# Patient Record
Sex: Male | Born: 1960 | Race: White | Hispanic: No | Marital: Single | Smoking: Current every day smoker
Health system: Southern US, Community
[De-identification: ages and names within clinical notes are randomized; demographics above are authoritative.]

## PROBLEM LIST (undated history)

## (undated) DIAGNOSIS — I8500 Esophageal varices without bleeding: Secondary | ICD-10-CM

## (undated) DIAGNOSIS — B1920 Unspecified viral hepatitis C without hepatic coma: Secondary | ICD-10-CM

## (undated) DIAGNOSIS — N19 Unspecified kidney failure: Secondary | ICD-10-CM

## (undated) DIAGNOSIS — F1020 Alcohol dependence, uncomplicated: Secondary | ICD-10-CM

## (undated) HISTORY — PX: ANKLE SURGERY: SHX546

---

## 2017-03-05 ENCOUNTER — Emergency Department: Payer: Medicaid Other

## 2017-03-05 ENCOUNTER — Emergency Department
Admission: EM | Admit: 2017-03-05 | Discharge: 2017-03-05 | Disposition: A | Discharge status: Home / Self Care | Payer: Medicaid Other | Attending: Emergency Medicine | Admitting: Emergency Medicine

## 2017-03-05 ENCOUNTER — Other Ambulatory Visit: Payer: Self-pay

## 2017-03-05 ENCOUNTER — Encounter: Payer: Self-pay | Admitting: Emergency Medicine

## 2017-03-05 DIAGNOSIS — S0083XA Contusion of other part of head, initial encounter: Secondary | ICD-10-CM | POA: Insufficient documentation

## 2017-03-05 DIAGNOSIS — Y939 Activity, unspecified: Secondary | ICD-10-CM | POA: Insufficient documentation

## 2017-03-05 DIAGNOSIS — W101XXA Fall (on)(from) sidewalk curb, initial encounter: Secondary | ICD-10-CM | POA: Insufficient documentation

## 2017-03-05 DIAGNOSIS — F17210 Nicotine dependence, cigarettes, uncomplicated: Secondary | ICD-10-CM | POA: Diagnosis not present

## 2017-03-05 DIAGNOSIS — S8992XA Unspecified injury of left lower leg, initial encounter: Secondary | ICD-10-CM | POA: Diagnosis present

## 2017-03-05 DIAGNOSIS — Y929 Unspecified place or not applicable: Secondary | ICD-10-CM | POA: Diagnosis not present

## 2017-03-05 DIAGNOSIS — Y999 Unspecified external cause status: Secondary | ICD-10-CM | POA: Diagnosis not present

## 2017-03-05 DIAGNOSIS — W19XXXA Unspecified fall, initial encounter: Secondary | ICD-10-CM

## 2017-03-05 DIAGNOSIS — S0003XA Contusion of scalp, initial encounter: Secondary | ICD-10-CM

## 2017-03-05 DIAGNOSIS — S8002XA Contusion of left knee, initial encounter: Secondary | ICD-10-CM

## 2017-03-05 HISTORY — DX: Unspecified viral hepatitis C without hepatic coma: B19.20

## 2017-03-05 HISTORY — DX: Unspecified kidney failure: N19

## 2017-03-05 HISTORY — DX: Alcohol dependence, uncomplicated: F10.20

## 2017-03-05 HISTORY — DX: Esophageal varices without bleeding: I85.00

## 2017-03-05 MED ORDER — ACETAMINOPHEN-CODEINE #3 300-30 MG PO TABS: 1.0000 | ORAL_TABLET | Freq: Four times a day (QID) | ORAL | 0 refills | Status: AC | PRN

## 2017-03-05 NOTE — ED Triage Notes (Addendum)
Pt presents to ED via [facility] from home c/o fall with R knee injury. Pt states he slipped off of a curb. Pt appears intoxicated, states hx alcoholism. When asked how much he typically drinks pt just states "I drink whatever I can get my hands on as long as it's alcohol." Reports last drink 10am today. Pt able to transfer from EMS stretcher to bed with +1 assist.

## 2017-03-05 NOTE — ED Notes (Signed)
Patient's discharge and follow up information reviewed with patient by ED nursing staff and patient given the opportunity to ask questions pertaining to ED visit and discharge plan of care. Patient advised that should symptoms not continue to improve, resolve entirely, or should new symptoms develop then a follow up visit with their PCP or a return visit to the ED may be warranted. Patient verbalized consent and understanding of discharge plan of care including potential need for further evaluation. Patient discharged in stable condition per attending ED physician on duty.   Pt called friend to come and pick him up. Brought to lobby in wheelchair to wait for ride.

## 2017-03-05 NOTE — Discharge Instructions (Signed)
Follow-up with your primary care provider or [facility] if any continued problems.  You may use ice to your knee and to your scalp as needed for discomfort.  Take Tylenol 3 every 6 hours if needed for moderate pain.  Do not take this medication on empty stomach.  Return to the emergency department if any severe worsening of your symptoms.

## 2017-03-05 NOTE — ED Provider Notes (Signed)
[facility] Emergency Department Provider Note  ____________________________________________   First MD Initiated Contact with Patient 03/05/17 1256     (approximate)  I have reviewed the triage vital signs and the nursing notes.   HISTORY  Chief Complaint Knee Pain   HPI Matthew Allison is a 57 y.o. male is brought into the emergency department via EMS after he stumbled on a curb and fell.  Patient states that he did hit the right side of his scalp but had no loss of consciousness.  He denies any visual changes, nausea, vomiting or dizziness.  He complains mostly of right knee pain.  He denies any other symptoms.  Patient admits to one beer at 10 AM this morning.  He states that he drinks often and a lot.  Past Medical History:  Diagnosis Date  . Alcoholism (HCC)   . Esophageal varices (HCC)   . Hepatitis C   . Kidney failure     There are no active problems to display for this patient.   Past Surgical History:  Procedure Laterality Date  . ANKLE SURGERY      Prior to Admission medications   Medication Sig Start Date End Date Taking? Authorizing Provider  acetaminophen-codeine (TYLENOL #3) 300-30 MG tablet Take 1 tablet by mouth every 6 (six) hours as needed for moderate pain. 03/05/17   Tommi Rumps, PA-C    Allergies Asa [aspirin] and Nsaids  History reviewed. No pertinent family history.  Social History Social History   Tobacco Use  . Smoking status: Current Every Day Smoker    Packs/day: 0.25    Types: Cigarettes  . Smokeless tobacco: Never Used  Substance Use Topics  . Alcohol use: Yes    Comment: "whatever I can get as long as it's alcohol"  . Drug use: Yes    Types: Marijuana    Review of Systems Constitutional: No fever/chills Eyes: No visual changes. ENT: No trauma. Cardiovascular: Denies chest pain. Respiratory: Denies shortness of breath. Gastrointestinal: No abdominal pain.  No nausea, no vomiting.     Musculoskeletal: Negative for back pain.  Positive for right knee pain. Skin: Negative for rash. Neurological: Negative for headaches, focal weakness or numbness. ____________________________________________   PHYSICAL EXAM:  VITAL SIGNS: ED Triage Vitals  Enc Vitals Group     BP 03/05/17 1250 133/74     Pulse Rate 03/05/17 1250 86     Resp 03/05/17 1250 18     Temp 03/05/17 1250 97.9 F (36.6 C)     Temp Source 03/05/17 1250 Oral     SpO2 03/05/17 1250 96 %     Weight 03/05/17 1251 230 lb (104.3 kg)     Height 03/05/17 1251 5\' 6"  (1.676 m)     Head Circumference --      Peak Flow --      Pain Score 03/05/17 1252 8     Pain Loc --      Pain Edu? --      Excl. in GC? --    Constitutional: Alert and oriented. Well appearing and in no acute distress.  Patient with faint odor of EtOH but does not appear to be intoxicated at this time.  Patient is able to answer questions appropriately and follow commands. Eyes: Conjunctivae are normal. PERRL. EOMI. Head: There is a faint superficial abrasion without active bleeding on the right temporal area that is slightly tender to touch.  No soft tissue swelling is noted in this area. Nose: No  congestion/rhinnorhea. Neck: No stridor.  No cervical tenderness on palpation posteriorly. Cardiovascular: Normal rate, regular rhythm. Grossly normal heart sounds.  Good peripheral circulation. Respiratory: Normal respiratory effort.  No retractions. Lungs CTAB. Gastrointestinal: Soft and nontender. No distention.  Musculoskeletal: Nontender thoracic or lumbar spine.  On examination of the left knee there is no gross deformity however there is general degenerative changes noted.  No effusion.  On examination of the right knee there is no gross deformity.  There is crepitus with range of motion.  There is no effusion or noted soft tissue swelling present.  Skin is intact.  No ecchymosis or abrasions were noted.  Nontender tib-fib and ankle to palpation on  the right. Neurologic:  Normal speech and language. No gross focal neurologic deficits are appreciated.  Skin:  Skin is warm, dry.  Abrasion right scalp as noted above. Psychiatric: Mood and affect are normal. Speech and behavior are normal.  ____________________________________________   LABS (all labs ordered are listed, but only abnormal results are displayed)  Labs Reviewed - No data to display  RADIOLOGY  ED MD interpretation:   Right knee x-ray is negative for fracture.  There is degenerative changes present.  Official radiology report(s): Ct Head Wo Contrast  Result Date: 03/05/2017 CLINICAL DATA:  Fall. EXAM: CT HEAD WITHOUT CONTRAST CT CERVICAL SPINE WITHOUT CONTRAST TECHNIQUE: Multidetector CT imaging of the head and cervical spine was performed following the standard protocol without intravenous contrast. Multiplanar CT image reconstructions of the cervical spine were also generated. COMPARISON:  None. FINDINGS: CT HEAD FINDINGS Brain: No evidence of acute infarction, hemorrhage, hydrocephalus, extra-axial collection or mass lesion/mass effect. Mild cerebral and cerebellar atrophy. Vascular: No hyperdense vessel or unexpected calcification. Skull: Normal. Negative for fracture or focal lesion. Sinuses/Orbits: No acute finding. Other: None. CT CERVICAL SPINE FINDINGS Evaluation is limited by motion artifact. Alignment: Straightening of the normal cervical lordosis. No traumatic malalignment. Skull base and vertebrae: No acute fracture. No primary bone lesion or focal pathologic process. Atlanto occipital degenerative changes. Soft tissues and spinal canal: No prevertebral fluid or swelling. No visible canal hematoma. Disc levels: Mild degenerative disc disease and endplate spurring from C4-C5 through C7-T1. Upper chest: Negative. Other: None. IMPRESSION: 1. No acute intracranial abnormality. Mild cerebral and cerebellar atrophy, greater than expected for age. 2. No acute cervical spine  fracture. Mild multilevel degenerative changes. Electronically Signed   By: Obie Dredge M.D.   On: 03/05/2017 14:13   Ct Cervical Spine Wo Contrast  Result Date: 03/05/2017 CLINICAL DATA:  Fall. EXAM: CT HEAD WITHOUT CONTRAST CT CERVICAL SPINE WITHOUT CONTRAST TECHNIQUE: Multidetector CT imaging of the head and cervical spine was performed following the standard protocol without intravenous contrast. Multiplanar CT image reconstructions of the cervical spine were also generated. COMPARISON:  None. FINDINGS: CT HEAD FINDINGS Brain: No evidence of acute infarction, hemorrhage, hydrocephalus, extra-axial collection or mass lesion/mass effect. Mild cerebral and cerebellar atrophy. Vascular: No hyperdense vessel or unexpected calcification. Skull: Normal. Negative for fracture or focal lesion. Sinuses/Orbits: No acute finding. Other: None. CT CERVICAL SPINE FINDINGS Evaluation is limited by motion artifact. Alignment: Straightening of the normal cervical lordosis. No traumatic malalignment. Skull base and vertebrae: No acute fracture. No primary bone lesion or focal pathologic process. Atlanto occipital degenerative changes. Soft tissues and spinal canal: No prevertebral fluid or swelling. No visible canal hematoma. Disc levels: Mild degenerative disc disease and endplate spurring from C4-C5 through C7-T1. Upper chest: Negative. Other: None. IMPRESSION: 1. No acute intracranial abnormality. Mild cerebral  and cerebellar atrophy, greater than expected for age. 2. No acute cervical spine fracture. Mild multilevel degenerative changes. Electronically Signed   By: Obie DredgeWilliam T Derry M.D.   On: 03/05/2017 14:13   Dg Knee Complete 4 Views Right  Result Date: 03/05/2017 CLINICAL DATA:  Twisted right knee.  Unable to walk. EXAM: RIGHT KNEE - COMPLETE 4+ VIEW COMPARISON:  None. FINDINGS: Moderate to severe degenerative changes most prominent the medial compartment. No fractures are seen. A moderate joint effusion is  noted. No other acute abnormalities. IMPRESSION: Joint effusion.  Degenerative changes.  No identified fracture. Electronically Signed   By: Gerome Samavid  Williams III M.D   On: 03/05/2017 13:31    ____________________________________________   PROCEDURES  Procedure(s) performed: None  Procedures  Critical Care performed: No  ____________________________________________   INITIAL IMPRESSION / ASSESSMENT AND PLAN / ED COURSE  Patient was made aware that his x-rays did not show any acute bony injury.  Patient is calling his roommate to pick him up.  Patient was given a prescription for Tylenol 3. 1 every 6 hours as needed for pain #10 no refill.  [facility].  He is return to the emergency room if any severe worsening of his symptoms.  He is encouraged to use ice to his knee if needed for pain or swelling. ____________________________________________   FINAL CLINICAL IMPRESSION(S) / ED DIAGNOSES  Final diagnoses:  Contusion of left knee, initial encounter  Right temporal frontal scalp contusions, initial encounter  Fall, initial encounter     ED Discharge Orders        Ordered    acetaminophen-codeine (TYLENOL #3) 300-30 MG tablet  Every 6 hours PRN     03/05/17 1432       Note:  This document was prepared using Dragon voice recognition software and may include unintentional dictation errors.    Tommi RumpsSummers, Jalana Moore L, PA-C 03/05/17 1510    Arnaldo NatalMalinda, Paul F, MD 03/05/17 [phone]

## 2018-10-09 IMAGING — CR DG KNEE COMPLETE 4+V*R*
1 series · 4 of 4 positions shown · non-contrast
Comparison: None.

CLINICAL DATA: Twisted right knee.  Unable to walk.

EXAM:
RIGHT KNEE - COMPLETE 4+ VIEW

[Series 1: dg knee complete 4 views right · 0.14mm/px · 4 of 4 slices shown]
[im 1/4]
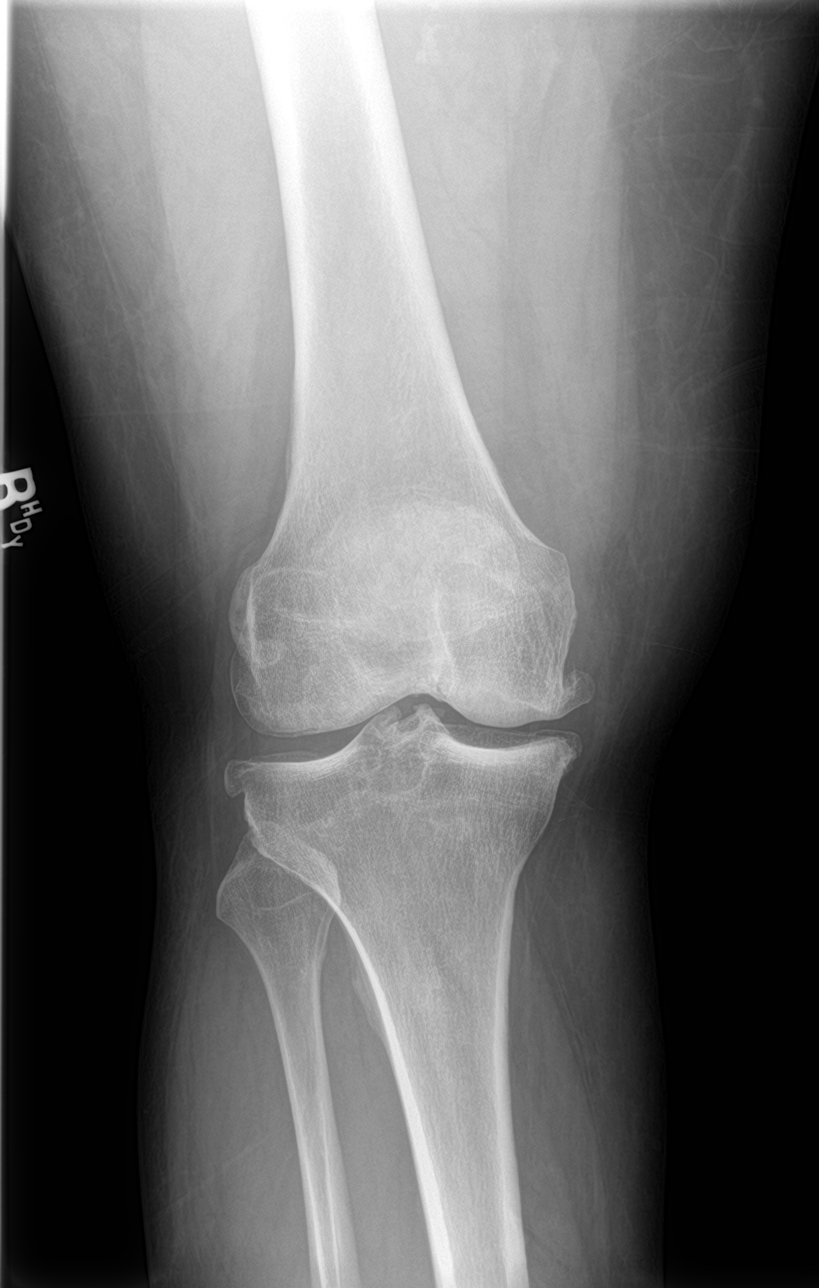
[im 2/4]
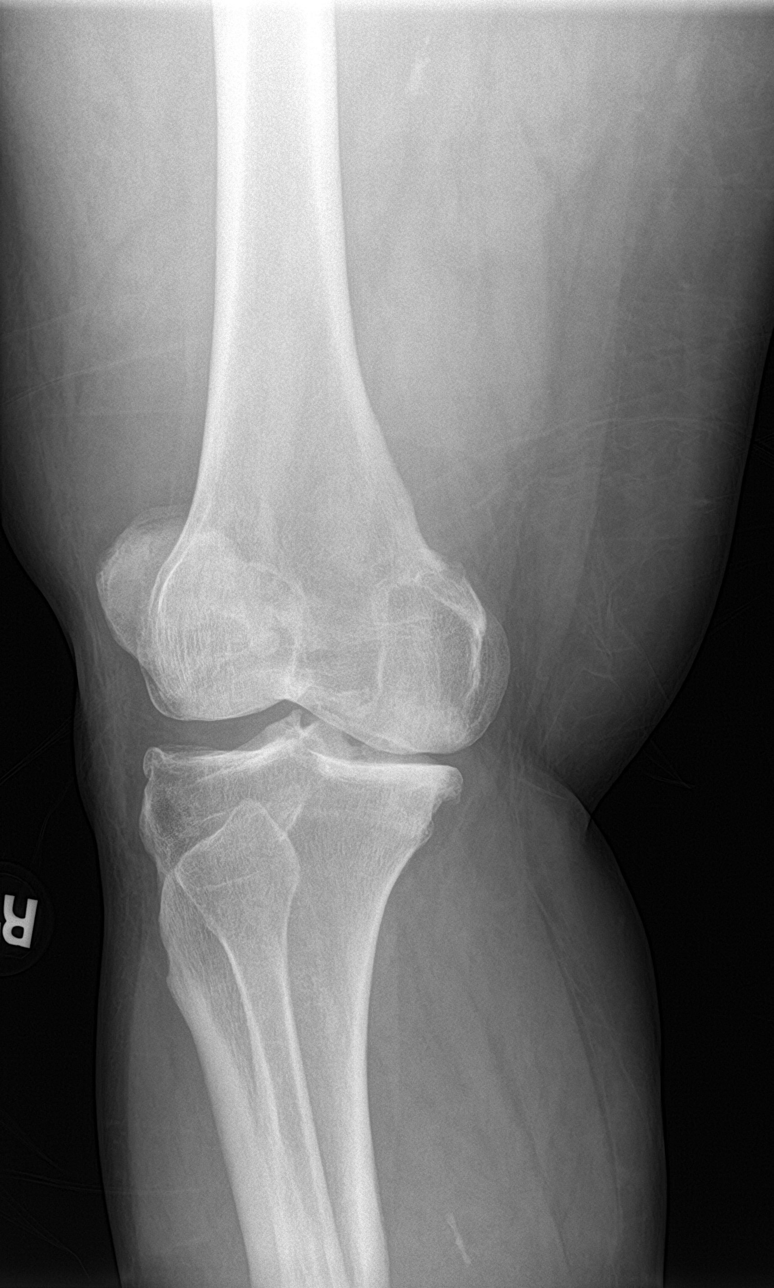
[im 3/4]
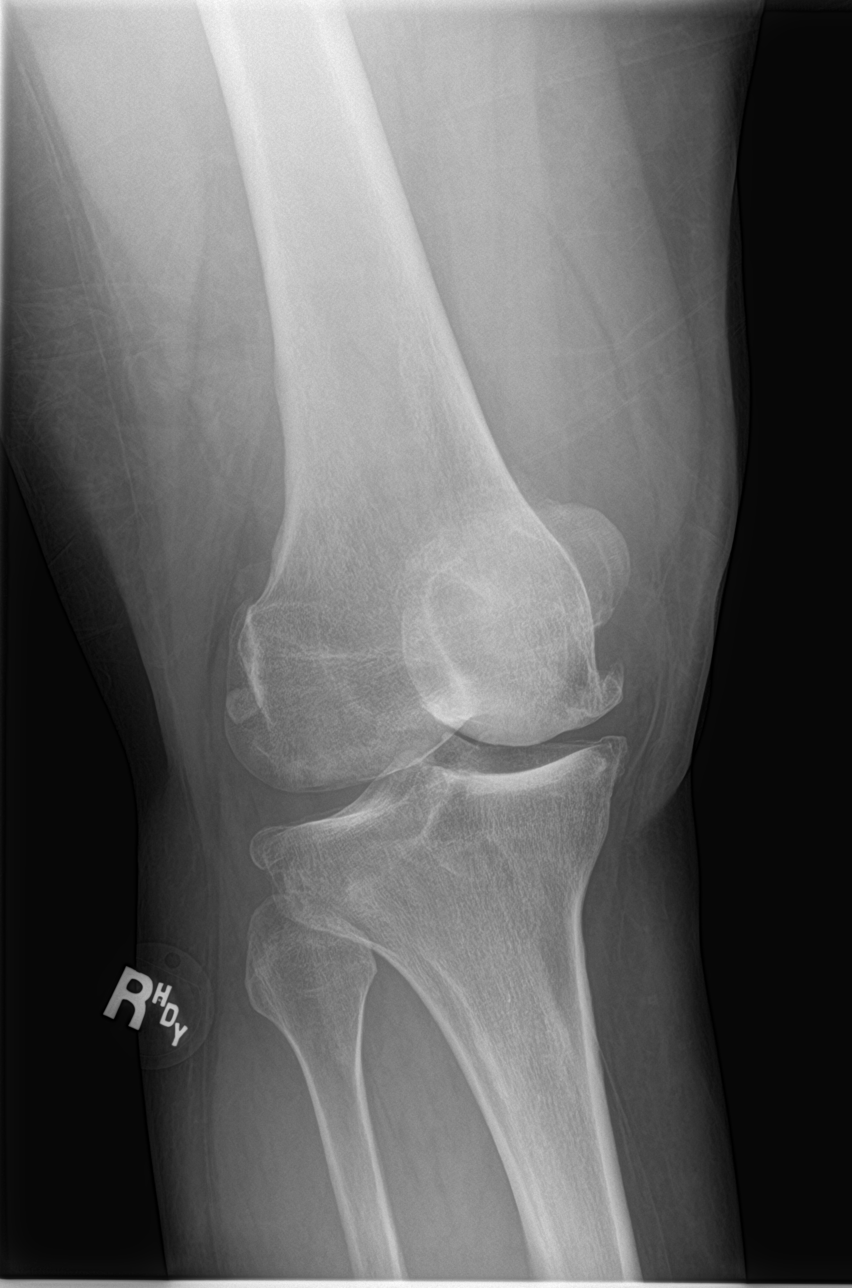
[im 4/4]
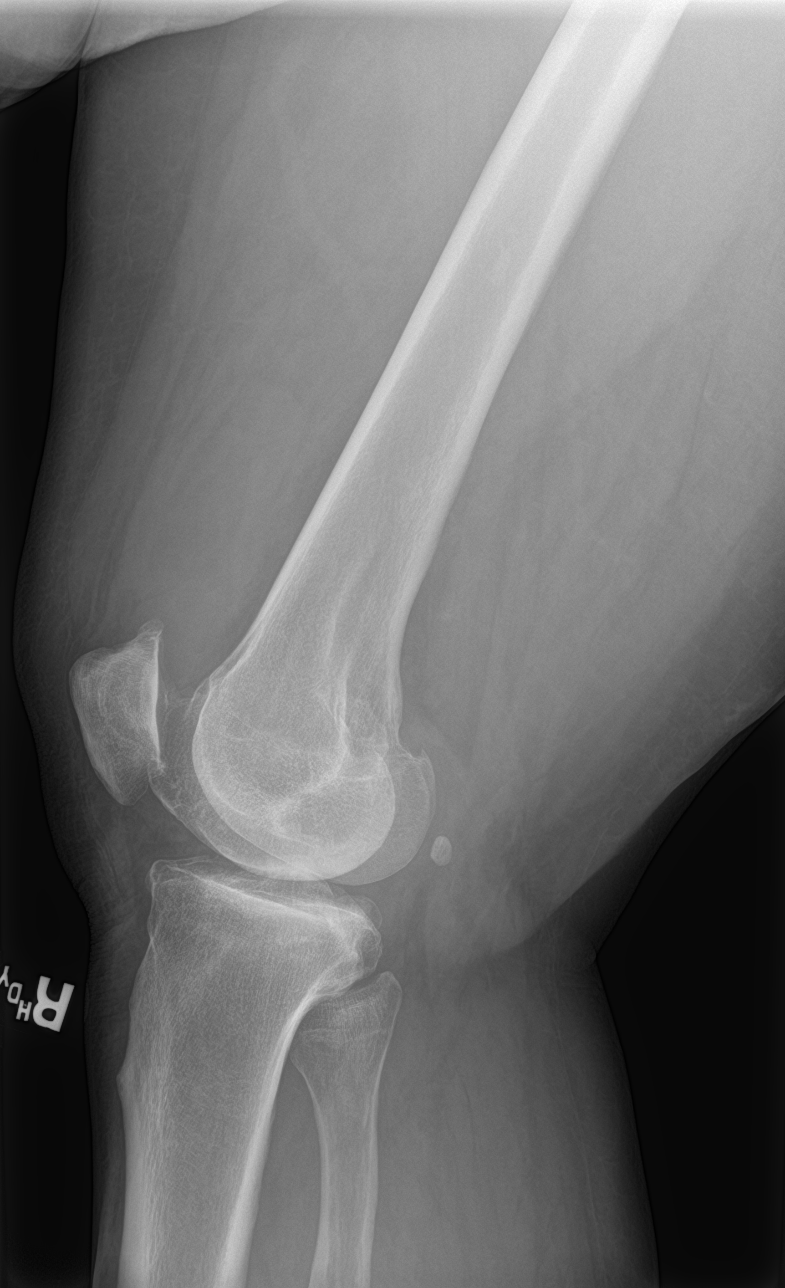

[4 of 4 positions shown; findings below may reference images not displayed]

FINDINGS: Moderate to severe degenerative changes most prominent the medial
compartment. No fractures are seen. A moderate joint effusion is
noted. No other acute abnormalities.
IMPRESSION: Joint effusion.  Degenerative changes.  No identified fracture.

## 2018-10-09 IMAGING — CT CT CERVICAL SPINE W/O CM
4 of 7 series · 14 of 33 positions shown, 15 images · non-contrast
Comparison: None.

CLINICAL DATA: Fall.

EXAM:
CT HEAD WITHOUT CONTRAST
CT CERVICAL SPINE WITHOUT CONTRAST
TECHNIQUE: Multidetector CT imaging of the head and cervical spine was
performed following the standard protocol without intravenous
contrast. Multiplanar CT image reconstructions of the cervical spine
were also generated.

[Series 4: coronal soft tissue · coronal · 0.30mm/px · 2 of 65 slices shown]
[im 22/65  bone]
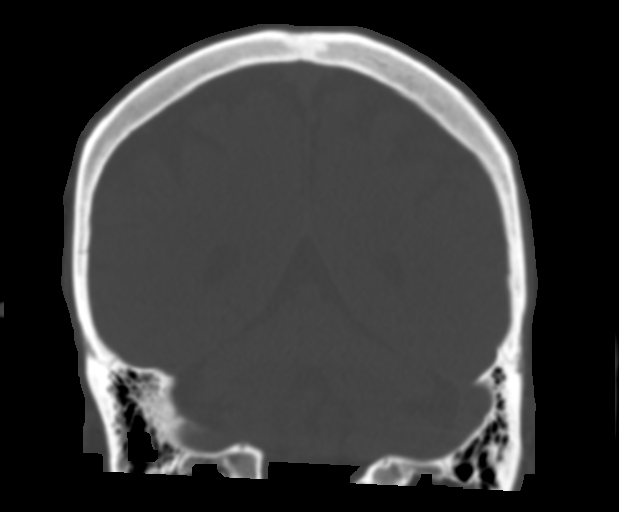
[im 43/65  bone]
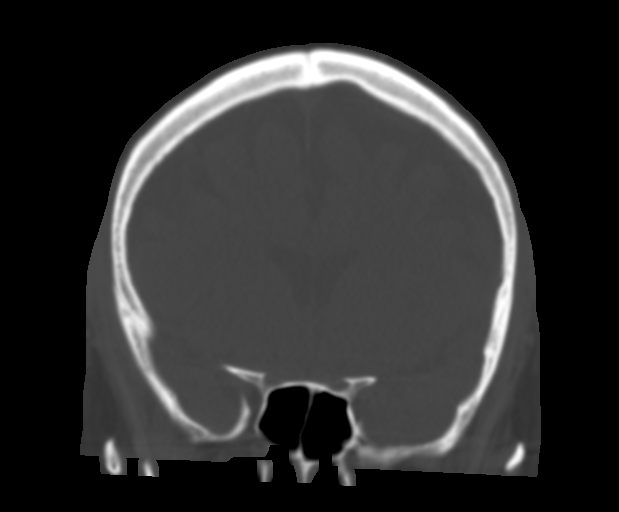

[Series 7: c spine soft · axial · 0.30mm/px · z∈[+228,+330]mm · 4 of 85 slices shown]
[im 17/85  soft-tissue]
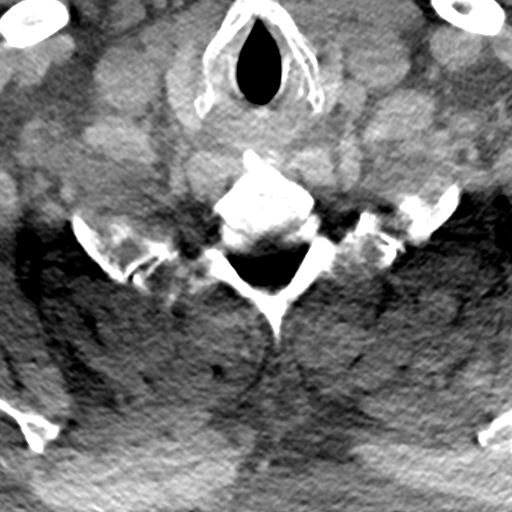
[im 34/85  soft-tissue]
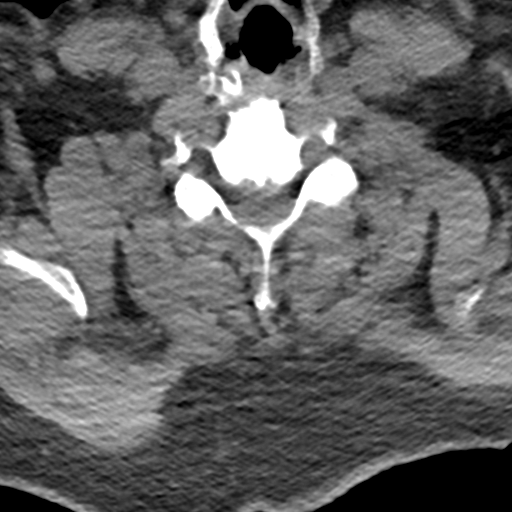
[im 51/85  soft-tissue]
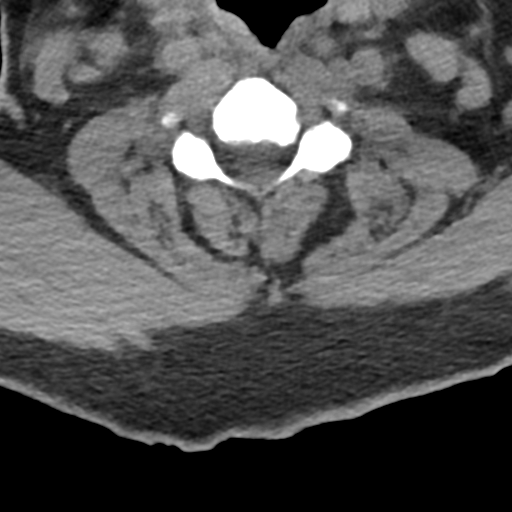
[im 68/85  soft-tissue]
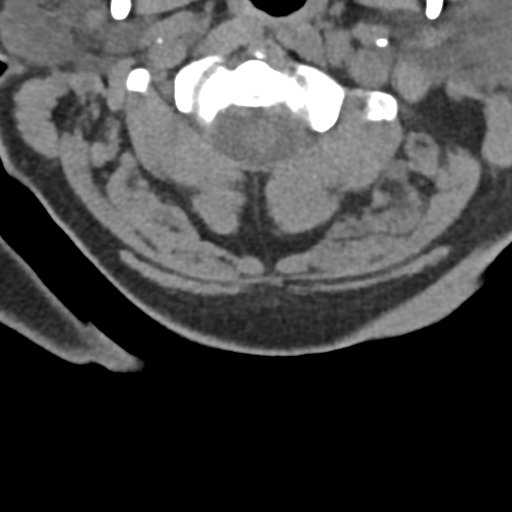

[Series 8: sagittal bone · sagittal · 0.22mm/px · 4 of 45 slices shown]
[im 9/45  bone]
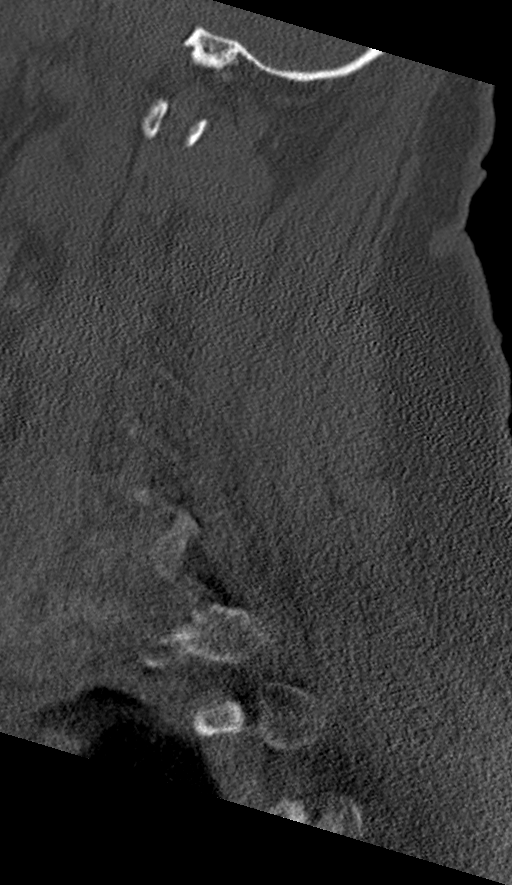
[im 18/45  bone]
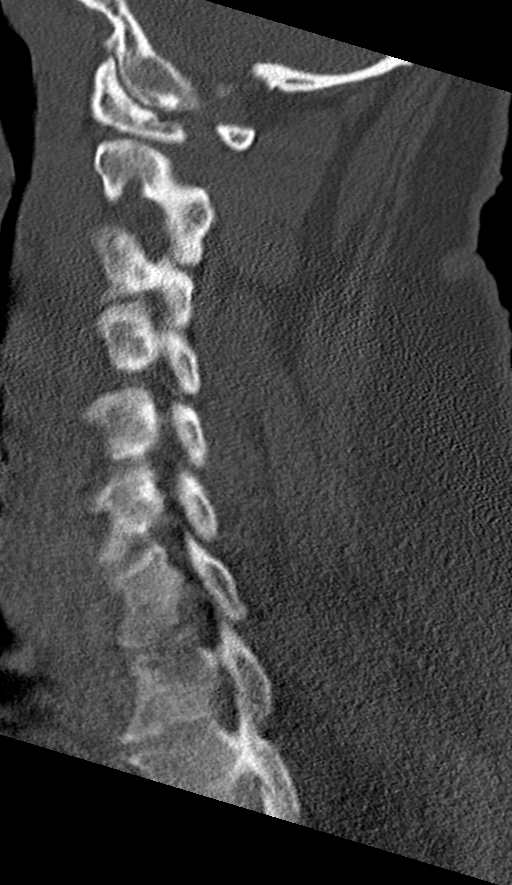
[im 27/45  bone]
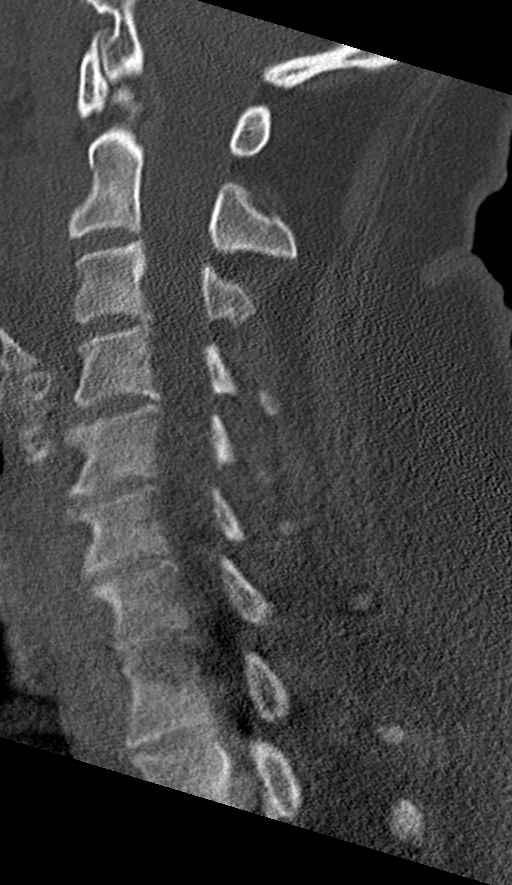
[im 36/45  bone]
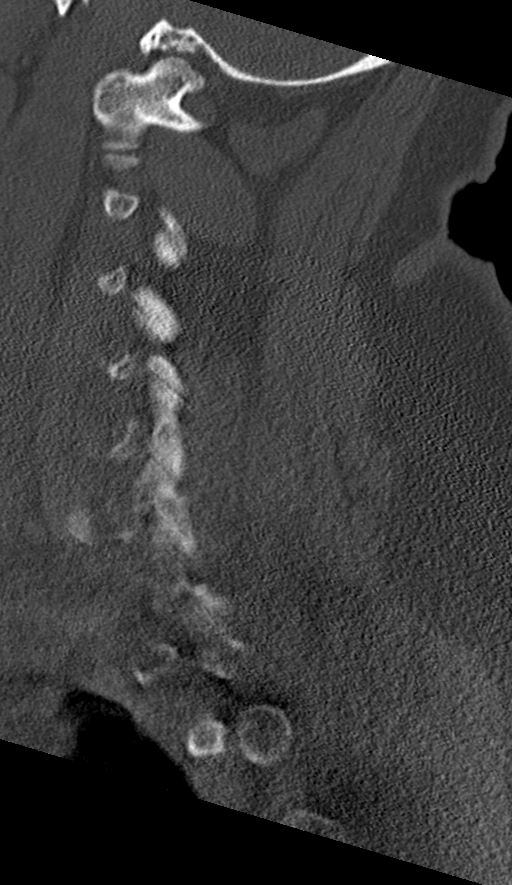

[Series 10: orthogonal bone · axial · 0.22mm/px · z∈[+216,+321]mm · 4 of 93 slices shown, 5 images]
[im 19/93  soft-tissue]
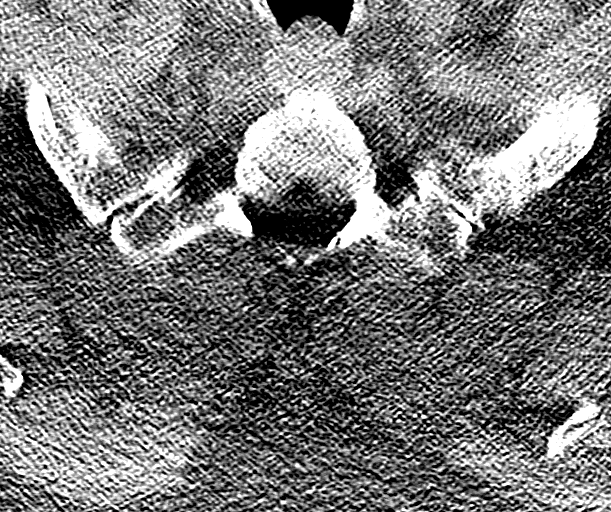
[im 19/93  bone]
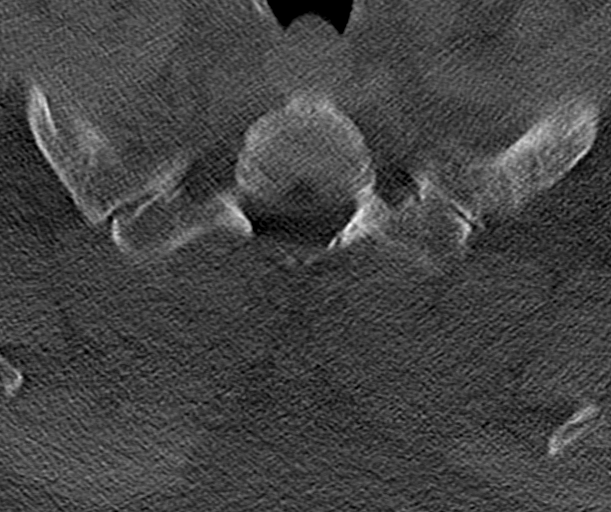
[im 37/93  bone]
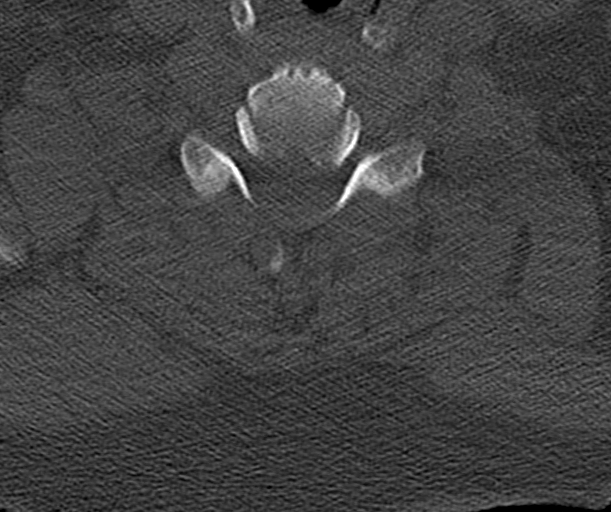
[im 56/93  bone]
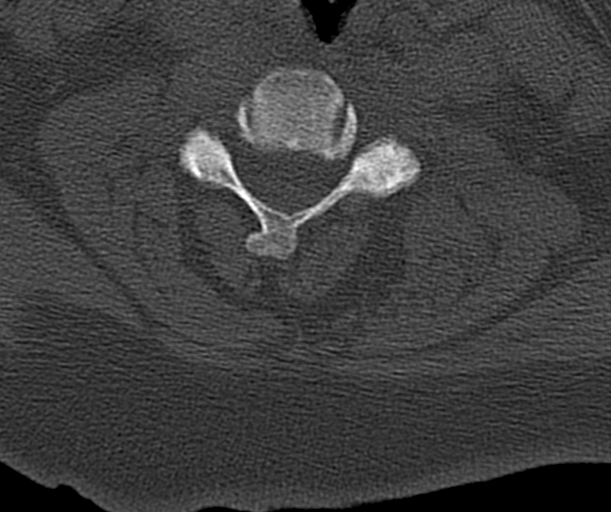
[im 74/93  bone]
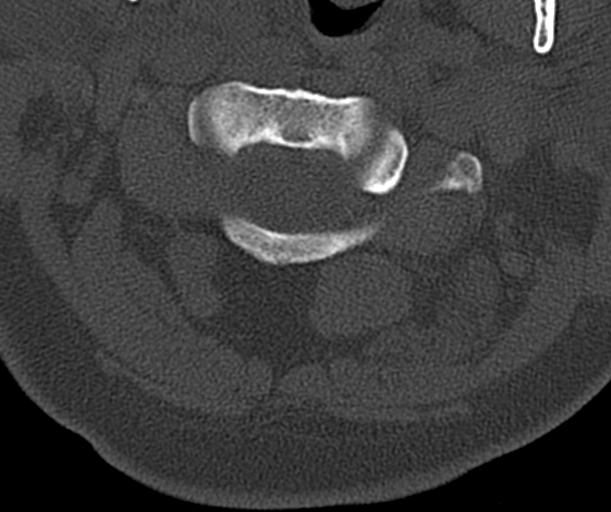

[14 of 33 positions shown; findings below may reference images not displayed]

FINDINGS: CT HEAD FINDINGS

Brain: No evidence of acute infarction, hemorrhage, hydrocephalus,
extra-axial collection or mass lesion/mass effect. Mild cerebral and
cerebellar atrophy.

Vascular: No hyperdense vessel or unexpected calcification.

Skull: Normal. Negative for fracture or focal lesion.

Sinuses/Orbits: No acute finding.

Other: None.

CT CERVICAL SPINE FINDINGS

Evaluation is limited by motion artifact.

Alignment: Straightening of the normal cervical lordosis. No
traumatic malalignment.

Skull base and vertebrae: No acute fracture. No primary bone lesion
or focal pathologic process. Atlanto occipital degenerative changes.

Soft tissues and spinal canal: No prevertebral fluid or swelling. No
visible canal hematoma.

Disc levels: Mild degenerative disc disease and endplate spurring
from C4-C5 through C7-T1.

Upper chest: Negative.

Other: None.
IMPRESSION: 1. No acute intracranial abnormality. Mild cerebral and cerebellar
atrophy, greater than expected for age.
2. No acute cervical spine fracture. Mild multilevel degenerative
changes.
